# Patient Record
Sex: Male | Born: 1955 | Race: White | Hispanic: No | Marital: Married
Health system: Southern US, Community
[De-identification: ages and names within clinical notes are randomized; demographics above are authoritative.]

---

## 2018-10-18 ENCOUNTER — Encounter (HOSPITAL_COMMUNITY): Payer: Self-pay | Admitting: Emergency Medicine

## 2018-10-18 ENCOUNTER — Emergency Department (HOSPITAL_COMMUNITY): Payer: BLUE CROSS/BLUE SHIELD

## 2018-10-18 ENCOUNTER — Inpatient Hospital Stay (HOSPITAL_COMMUNITY)
Admission: EM | Admit: 2018-10-18 | Discharge: 2018-10-20 | DRG: 871 | Disposition: A | Discharge status: Home / Self Care | Payer: BLUE CROSS/BLUE SHIELD | Attending: Internal Medicine | Admitting: Internal Medicine

## 2018-10-18 ENCOUNTER — Other Ambulatory Visit: Payer: Self-pay

## 2018-10-18 DIAGNOSIS — J189 Pneumonia, unspecified organism: Secondary | ICD-10-CM

## 2018-10-18 DIAGNOSIS — I10 Essential (primary) hypertension: Secondary | ICD-10-CM

## 2018-10-18 DIAGNOSIS — R197 Diarrhea, unspecified: Secondary | ICD-10-CM

## 2018-10-18 DIAGNOSIS — A419 Sepsis, unspecified organism: Principal | ICD-10-CM

## 2018-10-18 DIAGNOSIS — R110 Nausea: Secondary | ICD-10-CM | POA: Diagnosis present

## 2018-10-18 DIAGNOSIS — N179 Acute kidney failure, unspecified: Secondary | ICD-10-CM

## 2018-10-18 DIAGNOSIS — R6520 Severe sepsis without septic shock: Secondary | ICD-10-CM

## 2018-10-18 DIAGNOSIS — D72828 Other elevated white blood cell count: Secondary | ICD-10-CM

## 2018-10-18 DIAGNOSIS — D696 Thrombocytopenia, unspecified: Secondary | ICD-10-CM | POA: Diagnosis present

## 2018-10-18 DIAGNOSIS — E785 Hyperlipidemia, unspecified: Secondary | ICD-10-CM | POA: Diagnosis present

## 2018-10-18 DIAGNOSIS — R0902 Hypoxemia: Secondary | ICD-10-CM

## 2018-10-18 DIAGNOSIS — J181 Lobar pneumonia, unspecified organism: Secondary | ICD-10-CM | POA: Diagnosis not present

## 2018-10-18 LAB — COMPREHENSIVE METABOLIC PANEL
ALT: 34 U/L (ref 0–44)
AST: 31 U/L (ref 15–41)
Albumin: 3.9 g/dL (ref 3.5–5.0)
Alkaline Phosphatase: 54 U/L (ref 38–126)
Anion gap: 8 (ref 5–15)
BUN: 15 mg/dL (ref 8–23)
CHLORIDE: 105 mmol/L (ref 98–111)
CO2: 25 mmol/L (ref 22–32)
Calcium: 8.7 mg/dL — ABNORMAL LOW (ref 8.9–10.3)
Creatinine, Ser: 1.18 mg/dL (ref 0.61–1.24)
GFR calc Af Amer: >60 mL/min (ref >60)
GFR calc non Af Amer: >60 mL/min (ref >60)
Glucose, Bld: 134 mg/dL — ABNORMAL HIGH (ref 70–99)
Potassium: 4 mmol/L (ref 3.5–5.1)
Sodium: 138 mmol/L (ref 135–145)
Total Bilirubin: 0.9 mg/dL (ref 0.3–1.2)
Total Protein: 6.6 g/dL (ref 6.5–8.1)

## 2018-10-18 LAB — URINALYSIS, ROUTINE W REFLEX MICROSCOPIC
Bilirubin Urine: NEGATIVE
Glucose, UA: NEGATIVE mg/dL
Hgb urine dipstick: NEGATIVE
Ketones, ur: NEGATIVE mg/dL
Leukocytes,Ua: NEGATIVE
Nitrite: NEGATIVE
PROTEIN: NEGATIVE mg/dL
Specific Gravity, Urine: 1.021 (ref 1.005–1.030)
pH: 5 (ref 5.0–8.0)

## 2018-10-18 LAB — CBC
HCT: 44 % (ref 39.0–52.0)
HCT: 37.3 % — ABNORMAL LOW (ref 39.0–52.0)
Hemoglobin: 14.4 g/dL (ref 13.0–17.0)
Hemoglobin: 12.7 g/dL — ABNORMAL LOW (ref 13.0–17.0)
MCH: 28.6 pg (ref 26.0–34.0)
MCH: 28.9 pg (ref 26.0–34.0)
MCHC: 32.7 g/dL (ref 30.0–36.0)
MCHC: 34 g/dL (ref 30.0–36.0)
MCV: 87.5 fL (ref 80.0–100.0)
MCV: 85 fL (ref 80.0–100.0)
NRBC: 0 % (ref 0.0–0.2)
Platelets: 195 10*3/uL (ref 150–400)
Platelets: 147 10*3/uL — ABNORMAL LOW (ref 150–400)
RBC: 5.03 MIL/uL (ref 4.22–5.81)
RBC: 4.39 MIL/uL (ref 4.22–5.81)
RDW: 12 % (ref 11.5–15.5)
RDW: 11.9 % (ref 11.5–15.5)
WBC: 20 10*3/uL — ABNORMAL HIGH (ref 4.0–10.5)
WBC: 14.4 10*3/uL — ABNORMAL HIGH (ref 4.0–10.5)
nRBC: 0 % (ref 0.0–0.2)

## 2018-10-18 LAB — CREATININE, SERUM
Creatinine, Ser: 1.27 mg/dL — ABNORMAL HIGH (ref 0.61–1.24)
GFR calc Af Amer: >60 mL/min (ref >60)
GFR calc non Af Amer: >60 mL/min (ref >60)

## 2018-10-18 LAB — LACTIC ACID, PLASMA
Lactic Acid, Venous: 1.4 mmol/L (ref 0.5–1.9)
Lactic Acid, Venous: 1.2 mmol/L (ref 0.5–1.9)

## 2018-10-18 LAB — INFLUENZA PANEL BY PCR (TYPE A & B)
Influenza A By PCR: NEGATIVE
Influenza B By PCR: NEGATIVE

## 2018-10-18 LAB — POC OCCULT BLOOD, ED: FECAL OCCULT BLD: NEGATIVE

## 2018-10-18 MED ORDER — SODIUM CHLORIDE 0.9 % IV BOLUS
1000.0000 mL | Freq: Once | INTRAVENOUS | Status: AC
  Administered 2018-10-18: 1000 mL via INTRAVENOUS

## 2018-10-18 MED ORDER — IOHEXOL 300 MG/ML  SOLN
100.0000 mL | Freq: Once | INTRAMUSCULAR | Status: AC | PRN
  Administered 2018-10-18: 100 mL via INTRAVENOUS

## 2018-10-18 MED ORDER — ENOXAPARIN SODIUM 40 MG/0.4ML ~~LOC~~ SOLN
40.0000 mg | SUBCUTANEOUS | Status: DC
  Administered 2018-10-18 – 2018-10-19 (×2): 40 mg via SUBCUTANEOUS
  Filled 2018-10-18 (×2): qty 0.4

## 2018-10-18 MED ORDER — ONDANSETRON HCL 4 MG/2ML IJ SOLN: 4.0000 mg | Freq: Four times a day (QID) | INTRAMUSCULAR | Status: DC | PRN

## 2018-10-18 MED ORDER — ONDANSETRON HCL 4 MG/2ML IJ SOLN
4.0000 mg | Freq: Once | INTRAMUSCULAR | Status: AC
  Administered 2018-10-18: 4 mg via INTRAVENOUS
  Filled 2018-10-18: qty 2

## 2018-10-18 MED ORDER — ALPRAZOLAM 0.25 MG PO TABS: 0.2500 mg | ORAL_TABLET | Freq: Every day | ORAL | Status: DC | PRN

## 2018-10-18 MED ORDER — PANTOPRAZOLE SODIUM 40 MG PO TBEC
40.0000 mg | DELAYED_RELEASE_TABLET | Freq: Every day | ORAL | Status: DC
  Administered 2018-10-18 – 2018-10-19 (×2): 40 mg via ORAL
  Filled 2018-10-18 (×2): qty 1

## 2018-10-18 MED ORDER — SODIUM CHLORIDE 0.9 % IV BOLUS
250.0000 mL | Freq: Once | INTRAVENOUS | Status: AC
  Administered 2018-10-18: 250 mL via INTRAVENOUS

## 2018-10-18 MED ORDER — SODIUM CHLORIDE 0.9 % IV SOLN
1.0000 g | Freq: Once | INTRAVENOUS | Status: AC
  Administered 2018-10-18: 1 g via INTRAVENOUS
  Filled 2018-10-18: qty 10

## 2018-10-18 MED ORDER — ALPRAZOLAM 0.5 MG PO TABS: 0.5000 mg | ORAL_TABLET | Freq: Three times a day (TID) | ORAL | Status: DC | PRN

## 2018-10-18 MED ORDER — ONDANSETRON HCL 4 MG PO TABS: 4.0000 mg | ORAL_TABLET | Freq: Four times a day (QID) | ORAL | Status: DC | PRN

## 2018-10-18 MED ORDER — LORAZEPAM 2 MG/ML IJ SOLN
1.0000 mg | Freq: Once | INTRAMUSCULAR | Status: AC
  Administered 2018-10-18: 1 mg via INTRAVENOUS
  Filled 2018-10-18: qty 1

## 2018-10-18 MED ORDER — SODIUM CHLORIDE 0.9 % IV SOLN
1.0000 g | INTRAVENOUS | Status: DC
  Administered 2018-10-18 – 2018-10-19 (×2): 1 g via INTRAVENOUS
  Filled 2018-10-18 (×2): qty 10

## 2018-10-18 MED ORDER — DEXTROSE-NACL 5-0.9 % IV SOLN
INTRAVENOUS | Status: DC
  Administered 2018-10-18 – 2018-10-19 (×3): via INTRAVENOUS

## 2018-10-18 MED ORDER — ACETAMINOPHEN 650 MG RE SUPP: 650.0000 mg | Freq: Four times a day (QID) | RECTAL | Status: DC | PRN

## 2018-10-18 MED ORDER — SODIUM CHLORIDE 0.9 % IV SOLN
500.0000 mg | Freq: Once | INTRAVENOUS | Status: AC
  Administered 2018-10-18: 500 mg via INTRAVENOUS
  Filled 2018-10-18: qty 500

## 2018-10-18 MED ORDER — DEXTROSE-NACL 5-0.9 % IV SOLN: INTRAVENOUS | Status: DC

## 2018-10-18 MED ORDER — EZETIMIBE-SIMVASTATIN 10-20 MG PO TABS
1.0000 | ORAL_TABLET | Freq: Every day | ORAL | Status: DC
  Administered 2018-10-19: 1 via ORAL
  Filled 2018-10-18: qty 1

## 2018-10-18 MED ORDER — AZITHROMYCIN 500 MG PO TABS
500.0000 mg | ORAL_TABLET | Freq: Every day | ORAL | Status: DC
  Administered 2018-10-18 – 2018-10-19 (×2): 500 mg via ORAL
  Filled 2018-10-18 (×2): qty 1

## 2018-10-18 MED ORDER — PAROXETINE HCL 20 MG PO TABS
20.0000 mg | ORAL_TABLET | Freq: Every day | ORAL | Status: DC
  Administered 2018-10-19: 20 mg via ORAL
  Filled 2018-10-18 (×2): qty 1

## 2018-10-18 MED ORDER — ACETAMINOPHEN 325 MG PO TABS
650.0000 mg | ORAL_TABLET | Freq: Four times a day (QID) | ORAL | Status: DC | PRN
  Administered 2018-10-18: 650 mg via ORAL
  Filled 2018-10-18: qty 2

## 2018-10-18 NOTE — H&P (Addendum)
History and Physical    Jadiah Genovese LNL:[id number] DOB: 04/21/1956 DOA: 10/18/2018  PCP: Patient, No Pcp Per   Patient coming from: Home   Chief Complaint:  Dyspnea   HPI: Zyron Krishal Freitag is a 63 y.o. male with medical history significant of hypertension and dyslipidemia.  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 He has been at his usual state of health until this morning when he felt generalized malaise. After breakfast he started having diarrhea, 3-4 watery stools.  He felt dizzy, and lightheaded, very weak.  He does mention fullness in his precordial region, moderate in intensity, persistent since this morning, no improving or worsening factors.  He has noted cough but no sputum production.  Denies any fevers or chills.  Due to persistent symptoms he presented to the emergency room.  He follows up with [facility] for his primary care.  Old records personally reviewed, last visit from September 05, 2017 with his primary care physician, listed that he takes alprazolam 0.25 mg daily, Vytorin, lisinopril and paroxetine.  ED Course: Patient was found ill looking appearing, tachycardic and tachypneic, but afebrile.  Significant leukocytosis and a positive infiltrate at the left lower lobe.  He was diagnosed with sepsis, received IV fluid resuscitation, IV antibiotic therapy and referred for admission for evaluation.  Review of Systems:  1. General: No fevers, no chills, no weight gain or weight loss. Positive malaise as mentioned in HPI.  2. ENT: No runny nose or sore throat, no hearing disturbances 3. Pulmonary: No dyspnea, positive cough, but no wheezing, or hemoptysis 4. Cardiovascular: No angina, claudication, lower extremity edema, pnd or orthopnea 5. Gastrointestinal: No nausea or vomiting, positive diarrhea, non bloody. No abdominal pain.  6. Hematology: No easy bruisability or frequent  infections 7. Urology: No dysuria, hematuria or increased urinary frequency 8. Dermatology: No rashes. 9. Neurology: No seizures or paresthesias 10. Musculoskeletal: No joint pain or deformities  Social history: Denies any tobacco alcohol or illegal drug abuse  Family history: Positive for heart disease    Prior to Admission medications   Not on File    Physical Exam: Vitals:   10/18/18 1414 10/18/18 1415 10/18/18 1445 10/18/18 1515  BP:  (!) 143/86 (!) 143/76 125/80  Pulse:  (!) 116 (!) 118 (!) 114  Resp:  (!) 21 (!) 9 (!) 25  Temp: 98.4 F (36.9 C)     TempSrc: Oral     SpO2:  95% 94% 94%  Weight:      Height:        Vitals:   10/18/18 1414 10/18/18 1415 10/18/18 1445 10/18/18 1515  BP:  (!) 143/86 (!) 143/76 125/80  Pulse:  (!) 116 (!) 118 (!) 114  Resp:  (!) 21 (!) 9 (!) 25  Temp: 98.4 F (36.9 C)     TempSrc: Oral     SpO2:  95% 94% 94%  Weight:      Height:       General: deconditioned and ill looking appearing Neurology: Awake and alert, non focal. Slow to respond to questions.  Head and Neck. Head normocephalic. Neck supple with no adenopathy or thyromegaly.   E ENT: mild pallor, no icterus, oral mucosa dry Cardiovascular: No JVD. S1-S2 present, rhythmic, no gallops, rubs, or murmurs. No lower extremity edema. Pulmonary: positive breath sounds bilaterally, poor air movement, positive expiratory wheezing, bilateral rhonchi and bibasilar rales, predominantly at the  left base. Gastrointestinal. Abdomen with no organomegaly, non tender, no rebound or guarding Skin. No rashes Musculoskeletal: no joint deformities    Labs on Admission: I have personally reviewed following labs and imaging studies  CBC: Recent Labs  Lab 10/18/18 1307  WBC 20.0*  HGB 14.4  HCT 44.0  MCV 87.5  PLT 195   Basic Metabolic Panel: Recent Labs  Lab 10/18/18 1307  NA 138  K 4.0  CL 105  CO2 25  GLUCOSE 134*  BUN 15  CREATININE 1.18  CALCIUM 8.7*   GFR: Estimated  Creatinine Clearance: 60.7 mL/min (by C-G formula based on SCr of 1.18 mg/dL). Liver Function Tests: Recent Labs  Lab 10/18/18 1307  AST 31  ALT 34  ALKPHOS 54  BILITOT 0.9  PROT 6.6  ALBUMIN 3.9   No results for input(s): LIPASE, AMYLASE in the last 168 hours. No results for input(s): AMMONIA in the last 168 hours. Coagulation Profile: No results for input(s): INR, PROTIME in the last 168 hours. Cardiac Enzymes: No results for input(s): CKTOTAL, CKMB, CKMBINDEX, TROPONINI in the last 168 hours. BNP (last 3 results) No results for input(s): PROBNP in the last 8760 hours. HbA1C: No results for input(s): HGBA1C in the last 72 hours. CBG: No results for input(s): GLUCAP in the last 168 hours. Lipid Profile: No results for input(s): CHOL, HDL, LDLCALC, TRIG, CHOLHDL, LDLDIRECT in the last 72 hours. Thyroid Function Tests: No results for input(s): TSH, T4TOTAL, FREET4, T3FREE, THYROIDAB in the last 72 hours. Anemia Panel: No results for input(s): VITAMINB12, FOLATE, FERRITIN, TIBC, IRON, RETICCTPCT in the last 72 hours. Urine analysis: No results found for: COLORURINE, APPEARANCEUR, LABSPEC, PHURINE, GLUCOSEU, HGBUR, BILIRUBINUR, KETONESUR, PROTEINUR, UROBILINOGEN, NITRITE, LEUKOCYTESUR  Radiological Exams on Admission: Ct Abdomen Pelvis W Contrast  Result Date: 10/18/2018 CLINICAL DATA:  Acute generalized abdominal pain. EXAM: CT ABDOMEN AND PELVIS WITH CONTRAST TECHNIQUE: Multidetector CT imaging of the abdomen and pelvis was performed using the standard protocol following bolus administration of intravenous contrast. CONTRAST:  OMNIPAQUE IOHEXOL 300 MG/ML  SOLN COMPARISON:  None. FINDINGS: Lower chest: Left lower lobe airspace opacity is noted concerning for pneumonia. Moderate size sliding-type hiatal hernia is noted. Hepatobiliary: No gallstones or biliary dilatation is noted. Multiple hepatic cysts are noted. Pancreas: Unremarkable. No pancreatic ductal dilatation or  surrounding inflammatory changes. Spleen: Normal in size without focal abnormality. Adrenals/Urinary Tract: Adrenal glands are unremarkable. Kidneys are normal, without renal calculi, focal lesion, or hydronephrosis. Bladder is unremarkable. Stomach/Bowel: Stomach is within normal limits. Appendix appears normal. No evidence of bowel wall thickening, distention, or inflammatory changes. Vascular/Lymphatic: No significant vascular findings are present. No enlarged abdominal or pelvic lymph nodes. Reproductive: Prostate is unremarkable. Other: No abdominal wall hernia or abnormality. No abdominopelvic ascites. Musculoskeletal: No acute or significant osseous findings. IMPRESSION: Moderate size sliding-type hiatal hernia is noted. Left lower lobe airspace opacity is noted consistent with pneumonia. No other abnormality seen in the abdomen or pelvis. Electronically Signed   By: Lupita Raider, M.D.   On: 10/18/2018 16:28   Dg Chest Port 1 View  Result Date: 10/18/2018 CLINICAL DATA:  Cough. EXAM: PORTABLE CHEST 1 VIEW COMPARISON:  None. FINDINGS: Normal sized heart. Patchy airspace opacity in the left lower lung zone. This most likely involving the lingula and left lower lobe. The right lung is clear. No visible pleural fluid. Mild lower thoracic spine degenerative changes. IMPRESSION: Left lower lung zone pneumonia, most likely involving the lingula and left lower lobe. Electronically Signed  By: Beckie Salts M.D.   On: 10/18/2018 15:35    EKG: Independently reviewed.  100 bpm, sinus rhythm, normal axis, right bundle branch block, inferior lateral small Q waves, T wave inversion in lead III and aVF.  No significant ST segment changes.  Assessment/Plan Active Problems:   Sepsis [facility])  63 year old male with significant past medical history for hypertension and dyslipidemia who presents with acute change in his overall health consistent with watery diarrhea, malaise, generalized weakness and fullness  sensation in his precordial region.  Positive cough but no significant sputum production.  No fevers.  On his initial physical examination his temperature is 98.4 F, blood pressure 154/84, heart rate 114, respiratory rate 27, oxygen saturation 93%.  He does have dry mucous membranes, significant abnormal lung auscultation with decreased breath sounds, expiratory wheezing, diffuse rhonchi and predominantly left basilar rales.  Heart S1-S2 present, tachycardic, abdomen soft, no lower extremity edema, no rashes.  Sodium 138, potassium 4.0, chloride 105, bicarb 25, glucose 134, BUN 15, creatinine 1.18, AST 31, ALT 34, white count 20.0, hemoglobin 14.4, hematocrit 44.0, platelets 195.  Chest x-ray, hypoinflated, left lower lobe alveolar infiltrate.  CT of the abdomen, base of the lungs with a left dense alveolar infiltrate.  Patient will be admitted to the hospital with the working diagnosis of sepsis due to left lower lobe community-acquired pneumonia, complicated by acute kidney injury.   1.  Sepsis due to left lower lobe community-acquired pneumonia (present on admission).  Patient presents with atypical symptoms but his physical examination and radiographic evaluation are consistent with left lower lobe community-acquired pneumonia.  Patient will be admitted to the telemetry unit, continue IV fluids with isotonic saline, broad spectrum antibiotic therapy with intravenous ceftriaxone and oral azithromycin. Will order urinary antigens for pneumococcus and Legionella.  Follow-up cell count, temperature curve and cultures.  Continue oximetry monitoring.  Follow-up on influenza screen, his GI symptoms might be secondary to a viral syndrome.   2.  Acute kidney injury.  Elevated creatinine up to 1.18, will continue isotonic saline at 100 mL/h as fluid resuscitation, follow-up urine output, avoid hypotension and nephrotoxic agents.  Check renal panel in the morning.  3.  Hypertension.  Hold on antihypertensive  agents due to risk of hypotension.  4.  Dyslipidemia.  Continue antihyperlipidemic agents.  DVT prophylaxis:  Enoxaparin  Code Status:  full  Family Communication: no family at the bedside   Disposition Plan:  Telemetry   Consults called: none   Admission status: Inpatient.     Dayanara Sherrill Annett Gula MD [facility]   10/18/2018, 4:36 PM

## 2018-10-18 NOTE — ED Notes (Signed)
Pt SpO2 of 86% at this time while patient is resting. Pt placed on 2L [place] by this RN and patient SpO2 now 98% on 2L via [place].

## 2018-10-18 NOTE — ED Triage Notes (Signed)
Pt BIB [facility]. Per EMS patient was at work when he developed sudden onset of nausea, vomiting, and diarrhea. Per patient the diarrhea was  Black and tarry in appearance. Per EMS pt had mulitple hypotensive episodes while vomiting of blood pressure being 50/30 and then 60/30.  Pt was given 4 of zofran by EMS  And ems reports patient did not have anymore hypotensive episodes following the zofran. Pt was given of NSS. Pt denying any pain upon arrival to ED states he "doesn't feel well." Pt is alert and oriented x4 at present.

## 2018-10-18 NOTE — Progress Notes (Signed)
Pt temp 102.1. Tylenol given. Doctor notified. Will continue to monitor.

## 2018-10-18 NOTE — Progress Notes (Signed)
Attempted to call for report. 

## 2018-10-18 NOTE — ED Provider Notes (Signed)
  Physical Exam  BP 125/80   Pulse (!) 114   Temp 98.4 F (36.9 C) (Oral)   Resp (!) 25   Ht 5\' 7"  (1.702 m)   Wt 72.6 kg   SpO2 94%   BMI 25.06 kg/m   Physical Exam  ED Course/Procedures     Procedures  MDM  Received care of pt from Dr. Denton Lank at [facility]. Presented with nausea, vomiting, diarrhea, mild dry nonproductive cough.  CXR concerning for pneumonia.  Ordered additional fluid to total 30cc/kg, and abx for CAP.  Dr. Denton Lank called for admission with CT abdomen pending.        Alvira Monday, MD 10/19/18 1141

## 2018-10-18 NOTE — Progress Notes (Signed)
[name] is a 63 y.o. male patient admitted from ED awake, alert - oriented  X 4 - no acute distress noted.  VSS - Blood pressure 101/76, pulse (!) 115, temperature (!) 102.1 F (38.9 C), temperature source Oral, resp. rate (!) 25, height 5\' 7"  (1.702 m), weight 72.6 kg, SpO2 97 %.    IV in place, occlusive dsg intact without redness.  Orientation to room, and floor completed with information packet given to patient/family.  Patient declined safety video at this time.  Admission INP armband ID verified with patient/family, and in place.   SR up x 2, fall assessment complete, with patient and family able to verbalize understanding of risk associated with falls, and verbalized understanding to call nsg before up out of bed.  Call light within reach, patient able to voice, and demonstrate understanding.  Skin, clean-dry- intact without evidence of bruising, or skin tears.   No evidence of skin break down noted on exam.     Will cont to eval and treat per MD orders.  Jeralene Peters, RN 10/18/2018 6:46 PM

## 2018-10-18 NOTE — ED Provider Notes (Addendum)
[facility] EMERGENCY DEPARTMENT Provider Note   CSN: [id number] Arrival date & time: 10/18/18  1235    History   Chief Complaint Chief Complaint  Patient presents with  . Diarrhea    HPI Levy Richy Didier is a 63 y.o. male.     Patient c/o acute onset nausea, vomiting and diarrhea today. Several diarrhea stools, watery, not bloody. Nausea, emesis x 1, not bloody. Abdominal cramping, dull, diffuse, moderate, non radiating. No known bad food ingestion or ill contacts. No fever or chills. +episodic non prod cough, no sore throat or other uri symptoms. No dysuria. Feels lightheaded when stands. bp low per ems. No recent travel. No known ill contacts. No recent bad food exposure or recent abx use.   The history is provided by the patient and the EMS personnel.    History reviewed. No pertinent past medical history.  There are no active problems to display for this patient.   History reviewed. No pertinent surgical history.      Home Medications    Prior to Admission medications   Not on File    Family History No family history on file.  Social History Social History   Tobacco Use  . Smoking status: Not on file  Substance Use Topics  . Alcohol use: Not on file  . Drug use: Not on file     Allergies   Patient has no allergy information on record.   Review of Systems Review of Systems  Constitutional: Negative for fever.  HENT: Negative for sore throat.   Eyes: Negative for redness.  Respiratory: Positive for cough. Negative for shortness of breath.   Cardiovascular: Negative for chest pain.  Gastrointestinal: Positive for abdominal pain, diarrhea, nausea and vomiting.  Endocrine: Negative for polyuria.  Genitourinary: Negative for flank pain.  Musculoskeletal: Negative for back pain and neck pain.  Skin: Negative for rash.  Neurological: Negative for headaches.  Hematological: Does not bruise/bleed easily.   Psychiatric/Behavioral: Negative for confusion.     Physical Exam Updated Vital Signs BP (!) 141/87   Pulse (!) 115   Temp 98.4 F (36.9 C) (Oral)   Resp (!) 27   Ht 1.702 m (5\' 7" )   Wt 72.6 kg   SpO2 92%   BMI 25.06 kg/m   Physical Exam Vitals signs and nursing note reviewed.  Constitutional:      Appearance: Normal appearance. He is well-developed.  HENT:     Head: Atraumatic.     Nose: Nose normal.     Mouth/Throat:     Mouth: Mucous membranes are moist.     Pharynx: Oropharynx is clear.  Eyes:     General: No scleral icterus.    Conjunctiva/sclera: Conjunctivae normal.     Pupils: Pupils are equal, round, and reactive to light.  Neck:     Musculoskeletal: Normal range of motion and neck supple. No neck rigidity.     Trachea: No tracheal deviation.  Cardiovascular:     Rate and Rhythm: Normal rate and regular rhythm.     Pulses: Normal pulses.     Heart sounds: Normal heart sounds. No murmur. No friction rub. No gallop.   Pulmonary:     Effort: Pulmonary effort is normal. No accessory muscle usage or respiratory distress.     Breath sounds: Normal breath sounds.  Abdominal:     General: Bowel sounds are normal. There is no distension.     Palpations: Abdomen is soft. There is no mass.  Tenderness: There is no abdominal tenderness. There is no guarding or rebound.     Hernia: No hernia is present.  Genitourinary:    Comments: No cva tenderness. Light brown watery stool.  Musculoskeletal:        General: No swelling.  Skin:    General: Skin is warm and dry.     Findings: No rash.  Neurological:     Mental Status: He is alert.     Comments: Alert, speech clear.   Psychiatric:        Mood and Affect: Mood normal.      ED Treatments / Results  Labs (all labs ordered are listed, but only abnormal results are displayed) Results for orders placed or performed during the hospital encounter of 10/18/18  CBC  Result Value Ref Range   WBC 20.0 (H) 4.0 -  10.5 K/uL   RBC 5.03 4.22 - 5.81 MIL/uL   Hemoglobin 14.4 13.0 - 17.0 g/dL   HCT 23.9 53.2 - 02.3 %   MCV 87.5 80.0 - 100.0 fL   MCH 28.6 26.0 - 34.0 pg   MCHC 32.7 30.0 - 36.0 g/dL   RDW 34.3 56.8 - 61.6 %   Platelets 195 150 - 400 K/uL   nRBC 0.0 0.0 - 0.2 %  Comprehensive metabolic panel  Result Value Ref Range   Sodium 138 135 - 145 mmol/L   Potassium 4.0 3.5 - 5.1 mmol/L   Chloride 105 98 - 111 mmol/L   CO2 25 22 - 32 mmol/L   Glucose, Bld 134 (H) 70 - 99 mg/dL   BUN 15 8 - 23 mg/dL   Creatinine, Ser 8.37 0.61 - 1.24 mg/dL   Calcium 8.7 (L) 8.9 - 10.3 mg/dL   Total Protein 6.6 6.5 - 8.1 g/dL   Albumin 3.9 3.5 - 5.0 g/dL   AST 31 15 - 41 U/L   ALT 34 0 - 44 U/L   Alkaline Phosphatase 54 38 - 126 U/L   Total Bilirubin 0.9 0.3 - 1.2 mg/dL   GFR calc non Af Amer >60 >60 mL/min   GFR calc Af Amer >60 >60 mL/min   Anion gap 8 5 - 15  POC occult blood, ED Provider will collect  Result Value Ref Range   Fecal Occult Bld NEGATIVE NEGATIVE    EKG EKG Interpretation  Date/Time:  Friday October 18 2018 15:43:31 EDT Ventricular Rate:  110 PR Interval:    QRS Duration: 113 QT Interval:  341 QTC Calculation: 462 R Axis:   44 Text Interpretation:  Sinus tachycardia Incomplete right bundle branch block Non-specific ST-t changes No previous tracing Confirmed by Cathren Laine ([id number]) on 10/18/2018 4:21:06 PM   Radiology Dg Chest Port 1 View  Result Date: 10/18/2018 CLINICAL DATA:  Cough. EXAM: PORTABLE CHEST 1 VIEW COMPARISON:  None. FINDINGS: Normal sized heart. Patchy airspace opacity in the left lower lung zone. This most likely involving the lingula and left lower lobe. The right lung is clear. No visible pleural fluid. Mild lower thoracic spine degenerative changes. IMPRESSION: Left lower lung zone pneumonia, most likely involving the lingula and left lower lobe. Electronically Signed   By: Beckie Salts M.D.   On: 10/18/2018 15:35    Procedures Procedures (including critical  care time)  Medications Ordered in ED Medications  ondansetron (ZOFRAN) injection 4 mg (has no administration in time range)  sodium chloride 0.9 % bolus 1,000 mL (has no administration in time range)  LORazepam (ATIVAN) injection 1 mg (has  no administration in time range)  sodium chloride 0.9 % bolus 1,000 mL (0 mLs Intravenous Stopped 10/18/18 1437)  ondansetron (ZOFRAN) injection 4 mg (4 mg Intravenous Given 10/18/18 1305)     Initial Impression / Assessment and Plan / ED Course  I have reviewed the triage vital signs and the nursing notes.  Pertinent labs & imaging results that were available during my care of the patient were reviewed by me and considered in my medical decision making (see chart for details).  Iv ns bolus. zofran iv.   Labs sent.   Reviewed nursing notes and prior charts for additional history.   Labs reviewed - chem normal. Wbc markedly elevated. Mid to lower abd tenderness on exam. Will get ct.   Ct pending.   Additional ivf.   Nausea returns, zofran iv.   Signed out to Dr Dalene Seltzer to check pending labs (UA), cxr, and CT.   cxr later returned c/w pna. On recheck, room air pulse ox 88%, 2 liters o2 [place].  Pt denies chest pain. +non prod cough.  Reaffirmed with pt, no recent travel, or known contacts with those who have travelled (except 1 family member to [place]).   Medicine service consulted for admission.  CT reading remains pending.   Final Clinical Impressions(s) / ED Diagnoses   Final diagnoses:  None    ED Discharge Orders    None         Cathren Laine, MD 10/18/18 1623

## 2018-10-18 NOTE — ED Notes (Signed)
ED TO INPATIENT HANDOFF REPORT  ED Nurse Name and Phone #: Joni Reining, RN  S Name/Age/Gender Danny Mckinney 63 y.o. male Room/Bed: 018C/018C  Code Status   Code Status: Full Code  Home/SNF/Other Home Patient oriented to: self, place, time and situation Is this baseline? Yes   Triage Complete: Triage complete  Chief Complaint gi bleed  Triage Note Pt BIB [facility]. Per EMS patient was at work when he developed sudden onset of nausea, vomiting, and diarrhea. Per patient the diarrhea was  Black and tarry in appearance. Per EMS pt had mulitple hypotensive episodes while vomiting of blood pressure being 50/30 and then 60/30.  Pt was given 4 of zofran by EMS  And ems reports patient did not have anymore hypotensive episodes following the zofran. Pt was given of NSS. Pt denying any pain upon arrival to ED states he "doesn't feel well." Pt is alert and oriented x4 at present.    Allergies Not on File  Level of Care/Admitting Diagnosis ED Disposition    ED Disposition Condition Comment   Admit  Hospital Area: [facility] [[id number]]  Level of Care: Telemetry Medical [104]  Diagnosis: Sepsis [facility]) [[id number]]  Admitting Physician: Coralie Keens [[id number]]  Attending Physician: Coralie Keens [[id number]]  Estimated length of stay: 3 - 4 days  Certification:: I certify this patient will need inpatient services for at least 2 midnights  PT Class (Do Not Modify): Inpatient [101]  PT Acc Code (Do Not Modify): Private [1]       B Medical/Surgery History History reviewed. No pertinent past medical history. History reviewed. No pertinent surgical history.   A IV Location/Drains/Wounds Patient Lines/Drains/Airways Status   Active Line/Drains/Airways    Name:   Placement date:   Placement time:   Site:   Days:   Peripheral IV 10/18/18 Left Antecubital   10/18/18    1220    Antecubital   less than 1          Intake/Output Last 24  hours  Intake/Output Summary (Last 24 hours) at 10/18/2018 1652 Last data filed at 10/18/2018 1437 Gross per 24 hour  Intake 1800 ml  Output -  Net 1800 ml    Labs/Imaging Results for orders placed or performed during the hospital encounter of 10/18/18 (from the past 48 hour(s))  CBC     Status: Abnormal   Collection Time: 10/18/18  1:07 PM  Result Value Ref Range   WBC 20.0 (H) 4.0 - 10.5 K/uL   RBC 5.03 4.22 - 5.81 MIL/uL   Hemoglobin 14.4 13.0 - 17.0 g/dL   HCT 43.1 54.0 - 08.6 %   MCV 87.5 80.0 - 100.0 fL   MCH 28.6 26.0 - 34.0 pg   MCHC 32.7 30.0 - 36.0 g/dL   RDW 76.1 95.0 - 93.2 %   Platelets 195 150 - 400 K/uL   nRBC 0.0 0.0 - 0.2 %    Comment: Performed at [facility], [address], [place], [place] [address]  Comprehensive metabolic panel     Status: Abnormal   Collection Time: 10/18/18  1:07 PM  Result Value Ref Range   Sodium 138 135 - 145 mmol/L   Potassium 4.0 3.5 - 5.1 mmol/L   Chloride 105 98 - 111 mmol/L   CO2 25 22 - 32 mmol/L   Glucose, Bld 134 (H) 70 - 99 mg/dL   BUN 15 8 - 23 mg/dL   Creatinine, Ser 5.80 0.61 - 1.24 mg/dL  Calcium 8.7 (L) 8.9 - 10.3 mg/dL   Total Protein 6.6 6.5 - 8.1 g/dL   Albumin 3.9 3.5 - 5.0 g/dL   AST 31 15 - 41 U/L   ALT 34 0 - 44 U/L   Alkaline Phosphatase 54 38 - 126 U/L   Total Bilirubin 0.9 0.3 - 1.2 mg/dL   GFR calc non Af Amer >60 >60 mL/min   GFR calc Af Amer >60 >60 mL/min   Anion gap 8 5 - 15    Comment: Performed at [facility], [address] [place], [place] [address]  POC occult blood, ED Provider will collect     Status: None   Collection Time: 10/18/18  1:16 PM  Result Value Ref Range   Fecal Occult Bld NEGATIVE NEGATIVE   Ct Abdomen Pelvis W Contrast  Result Date: 10/18/2018 CLINICAL DATA:  Acute generalized abdominal pain. EXAM: CT ABDOMEN AND PELVIS WITH CONTRAST TECHNIQUE: Multidetector CT imaging of the abdomen and pelvis was performed using the standard protocol following bolus  administration of intravenous contrast. CONTRAST:  OMNIPAQUE IOHEXOL 300 MG/ML  SOLN COMPARISON:  None. FINDINGS: Lower chest: Left lower lobe airspace opacity is noted concerning for pneumonia. Moderate size sliding-type hiatal hernia is noted. Hepatobiliary: No gallstones or biliary dilatation is noted. Multiple hepatic cysts are noted. Pancreas: Unremarkable. No pancreatic ductal dilatation or surrounding inflammatory changes. Spleen: Normal in size without focal abnormality. Adrenals/Urinary Tract: Adrenal glands are unremarkable. Kidneys are normal, without renal calculi, focal lesion, or hydronephrosis. Bladder is unremarkable. Stomach/Bowel: Stomach is within normal limits. Appendix appears normal. No evidence of bowel wall thickening, distention, or inflammatory changes. Vascular/Lymphatic: No significant vascular findings are present. No enlarged abdominal or pelvic lymph nodes. Reproductive: Prostate is unremarkable. Other: No abdominal wall hernia or abnormality. No abdominopelvic ascites. Musculoskeletal: No acute or significant osseous findings. IMPRESSION: Moderate size sliding-type hiatal hernia is noted. Left lower lobe airspace opacity is noted consistent with pneumonia. No other abnormality seen in the abdomen or pelvis. Electronically Signed   By: Lupita Raider, M.D.   On: 10/18/2018 16:28   Dg Chest Port 1 View  Result Date: 10/18/2018 CLINICAL DATA:  Cough. EXAM: PORTABLE CHEST 1 VIEW COMPARISON:  None. FINDINGS: Normal sized heart. Patchy airspace opacity in the left lower lung zone. This most likely involving the lingula and left lower lobe. The right lung is clear. No visible pleural fluid. Mild lower thoracic spine degenerative changes. IMPRESSION: Left lower lung zone pneumonia, most likely involving the lingula and left lower lobe. Electronically Signed   By: Beckie Salts M.D.   On: 10/18/2018 15:35    Pending Labs Unresulted Labs (From admission, onward)    Start      Ordered   10/25/18 0500  Creatinine, serum  (enoxaparin (LOVENOX)    CrCl >/= 30 ml/min)  Weekly,   R    Comments:  while on enoxaparin therapy    10/18/18 1648   10/19/18 0500  Basic metabolic panel  Tomorrow morning,   R     10/18/18 1648   10/19/18 0500  CBC  Tomorrow morning,   R     10/18/18 1648   10/18/18 1649  HIV antibody (Routine Testing)  Once,   R     10/18/18 1648   10/18/18 1649  CBC  (enoxaparin (LOVENOX)    CrCl >/= 30 ml/min)  Once,   R    Comments:  Baseline for enoxaparin therapy IF NOT ALREADY DRAWN.  Notify  MD if PLT < 100 K.    10/18/18 1648   10/18/18 1649  Creatinine, serum  (enoxaparin (LOVENOX)    CrCl >/= 30 ml/min)  Once,   R    Comments:  Baseline for enoxaparin therapy IF NOT ALREADY DRAWN.    10/18/18 1648   10/18/18 1619  Influenza panel by PCR (type A & B)  (Influenza PCR Panel)  Once,   R     10/18/18 1618   10/18/18 1613  Lactic acid, plasma  Now then every 2 hours,   STAT     10/18/18 1614   10/18/18 1612  Blood culture (routine x 2)  BLOOD CULTURE X 2,   STAT     10/18/18 1614   10/18/18 1506  Urinalysis, Routine w reflex microscopic  ONCE - STAT,   R     10/18/18 1505          Vitals/Pain Today's Vitals   10/18/18 1415 10/18/18 1445 10/18/18 1515 10/18/18 1630  BP: (!) 143/86 (!) 143/76 125/80 122/73  Pulse: (!) 116 (!) 118 (!) 114 (!) 116  Resp: (!) 21 (!) 9 (!) 25 (!) 29  Temp:      TempSrc:      SpO2: 95% 94% 94% 93%  Weight:      Height:        Isolation Precautions Droplet precaution  Medications Medications  cefTRIAXone (ROCEPHIN) 1 g in sodium chloride 0.9 % 100 mL IVPB (1 g Intravenous New Bag/Given 10/18/18 1641)  azithromycin (ZITHROMAX) 500 mg in sodium chloride 0.9 % 250 mL IVPB (has no administration in time range)  enoxaparin (LOVENOX) injection 40 mg (has no administration in time range)  acetaminophen (TYLENOL) tablet 650 mg (has no administration in time range)    Or  acetaminophen (TYLENOL) suppository 650  mg (has no administration in time range)  ondansetron (ZOFRAN) tablet 4 mg (has no administration in time range)    Or  ondansetron (ZOFRAN) injection 4 mg (has no administration in time range)  sodium chloride 0.9 % bolus 1,000 mL (0 mLs Intravenous Stopped 10/18/18 1437)  ondansetron (ZOFRAN) injection 4 mg (4 mg Intravenous Given 10/18/18 1305)  ondansetron (ZOFRAN) injection 4 mg (4 mg Intravenous Given 10/18/18 1520)  sodium chloride 0.9 % bolus 1,000 mL (1,000 mLs Intravenous New Bag/Given 10/18/18 1520)  LORazepam (ATIVAN) injection 1 mg (1 mg Intravenous Given 10/18/18 1520)  iohexol (OMNIPAQUE) 300 MG/ML solution 100 mL (100 mLs Intravenous Contrast Given 10/18/18 1552)  sodium chloride 0.9 % bolus 250 mL (250 mLs Intravenous New Bag/Given 10/18/18 1641)    Mobility walks with person assist     Focused Assessments Pulmonary Assessment Handoff:  Lung sounds:            R Recommendations: See Admitting Provider Note  Report given to:   Additional Notes:

## 2018-10-19 DIAGNOSIS — J189 Pneumonia, unspecified organism: Secondary | ICD-10-CM

## 2018-10-19 LAB — CBC
HCT: 33.5 % — ABNORMAL LOW (ref 39.0–52.0)
Hemoglobin: 11.3 g/dL — ABNORMAL LOW (ref 13.0–17.0)
MCH: 28.8 pg (ref 26.0–34.0)
MCHC: 33.7 g/dL (ref 30.0–36.0)
MCV: 85.5 fL (ref 80.0–100.0)
Platelets: 127 10*3/uL — ABNORMAL LOW (ref 150–400)
RBC: 3.92 MIL/uL — ABNORMAL LOW (ref 4.22–5.81)
RDW: 12.3 % (ref 11.5–15.5)
WBC: 11.2 10*3/uL — ABNORMAL HIGH (ref 4.0–10.5)
nRBC: 0 % (ref 0.0–0.2)

## 2018-10-19 LAB — BASIC METABOLIC PANEL
Anion gap: 7 (ref 5–15)
BUN: 11 mg/dL (ref 8–23)
CO2: 23 mmol/L (ref 22–32)
Calcium: 7.7 mg/dL — ABNORMAL LOW (ref 8.9–10.3)
Chloride: 105 mmol/L (ref 98–111)
Creatinine, Ser: 1.28 mg/dL — ABNORMAL HIGH (ref 0.61–1.24)
GFR calc Af Amer: >60 mL/min (ref >60)
GFR calc non Af Amer: 60 mL/min — ABNORMAL LOW (ref >60)
Glucose, Bld: 124 mg/dL — ABNORMAL HIGH (ref 70–99)
Potassium: 3.6 mmol/L (ref 3.5–5.1)
Sodium: 135 mmol/L (ref 135–145)

## 2018-10-19 LAB — HIV ANTIBODY (ROUTINE TESTING W REFLEX): HIV Screen 4th Generation wRfx: NONREACTIVE

## 2018-10-19 LAB — STREP PNEUMONIAE URINARY ANTIGEN: Strep Pneumo Urinary Antigen: NEGATIVE

## 2018-10-19 MED ORDER — SIMETHICONE 80 MG PO CHEW
80.0000 mg | CHEWABLE_TABLET | Freq: Once | ORAL | Status: AC
  Administered 2018-10-19: 80 mg via ORAL
  Filled 2018-10-19: qty 1

## 2018-10-19 MED ORDER — LOPERAMIDE HCL 2 MG PO CAPS
2.0000 mg | ORAL_CAPSULE | Freq: Four times a day (QID) | ORAL | Status: DC | PRN
  Administered 2018-10-19: 2 mg via ORAL
  Filled 2018-10-19: qty 1

## 2018-10-19 NOTE — Plan of Care (Signed)
  Problem: Education: Goal: Knowledge of General Education information will improve Description Including pain rating scale, medication(s)/side effects and non-pharmacologic comfort measures Outcome: Progressing   Problem: Health Behavior/Discharge Planning: Goal: Ability to manage health-related needs will improve Outcome: Progressing   Problem: Clinical Measurements: Goal: Ability to maintain clinical measurements within normal limits will improve Outcome: Progressing Goal: Will remain free from infection Outcome: Progressing Goal: Diagnostic test results will improve Outcome: Progressing Goal: Respiratory complications will improve Outcome: Progressing Goal: Cardiovascular complication will be avoided Outcome: Progressing   Problem: Coping: Goal: Level of anxiety will decrease Outcome: Progressing   Problem: Activity: Goal: Ability to tolerate increased activity will improve Outcome: Progressing   Problem: Clinical Measurements: Goal: Ability to maintain a body temperature in the normal range will improve Outcome: Progressing   Problem: Respiratory: Goal: Ability to maintain adequate ventilation will improve Outcome: Progressing Goal: Ability to maintain a clear airway will improve Outcome: Progressing

## 2018-10-19 NOTE — Progress Notes (Addendum)
Patient ID: Danny Mckinney, male   DOB: [birth year], 63 y.o.   MRN: [id number]  PROGRESS NOTE    Danny Mckinney  WVX:[id number] DOB: [birth year] DOA: 10/18/2018 PCP: Patient, No Pcp Per   Brief Narrative:  63 year old male with history of hypertension and dyslipidemia presented with dyspnea and diarrhea on 10/18/2018.  He was found to have pneumonia and started on IV antibiotics.  Assessment & Plan:   Active Problems:   Sepsis (HCC)   Community acquired pneumonia   AKI (acute kidney injury) (HCC)   Dyslipidemia   Essential hypertension  Sepsis from pneumonia -Follow cultures.  Antibiotic plan as below.  Blood pressure on the lower side but stable.   Thrombocytopenia -Probably from above.  Monitor.  No signs of bleeding  Community-acquired left lower lobar pneumonia -Continue Rocephin and Zithromax.  Follow cultures. -Influenza negative.  Leukocytosis -From above.  Improving  Hypertension -Blood pressure on the lower side.  Antihypertensives on hold.  Monitor  Acute kidney injury -Monitor creatinine.  Encourage oral intake.  Decrease IV fluids to 75 cc an hour.  Dyslipidemia--continue antihyperlipidemic agents   DVT prophylaxis: Lovenox Code Status: Full Family Communication: None at bedside Disposition Plan: Home tomorrow if remains afebrile and if respiratory status remains stable  Consultants: None  Procedures: None  Antimicrobials: Rocephin and Zithromax from 10/18/2018 onwards   Subjective: Patient seen and examined at bedside.  He feels slightly better.  His diarrhea is improving.  Has not tried eating it.  Had fever yesterday evening.  No nausea or vomiting.  Objective: Vitals:   10/18/18 1957 10/18/18 1958 10/18/18 2102 10/19/18 0433  BP:  (!) 92/46 106/61 113/74  Pulse:  (!) 116 (!) 104 (!) 106  Resp:   19   Temp: 98.2 F (36.8 C)  99.9 F (37.7 C) 99.8 F (37.7 C)  TempSrc: Oral  Oral Oral  SpO2:  97% 93% 93%  Weight:      Height:         Intake/Output Summary (Last 24 hours) at 10/19/2018 1031 Last data filed at 10/19/2018 0500 Gross per 24 hour  Intake 3839.47 ml  Output 325 ml  Net 3514.47 ml   Filed Weights   10/18/18 1241 10/18/18 1829  Weight: 72.6 kg 72.6 kg    Examination:  General exam: Appears calm and comfortable  Respiratory system: Bilateral decreased breath sounds at bases, some scattered crackles Cardiovascular system: S1 & S2 heard, tachycardic Gastrointestinal system: Abdomen is nondistended, soft and nontender. Normal bowel sounds heard. Extremities: No cyanosis, clubbing, edema    Data Reviewed: I have personally reviewed following labs and imaging studies  CBC: Recent Labs  Lab 10/18/18 1307 10/18/18 1847 10/19/18 0459  WBC 20.0* 14.4* 11.2*  HGB 14.4 12.7* 11.3*  HCT 44.0 37.3* 33.5*  MCV 87.5 85.0 85.5  PLT 195 147* 127*   Basic Metabolic Panel: Recent Labs  Lab 10/18/18 1307 10/18/18 1847 10/19/18 0459  NA 138  --  135  K 4.0  --  3.6  CL 105  --  105  CO2 25  --  23  GLUCOSE 134*  --  124*  BUN 15  --  11  CREATININE 1.18 1.27* 1.28*  CALCIUM 8.7*  --  7.7*   GFR: Estimated Creatinine Clearance: 55.9 mL/min (A) (by C-G formula based on SCr of 1.28 mg/dL (H)). Liver Function Tests: Recent Labs  Lab 10/18/18 1307  AST 31  ALT 34  ALKPHOS 54  BILITOT 0.9  PROT 6.6  ALBUMIN 3.9   No results for input(s): LIPASE, AMYLASE in the last 168 hours. No results for input(s): AMMONIA in the last 168 hours. Coagulation Profile: No results for input(s): INR, PROTIME in the last 168 hours. Cardiac Enzymes: No results for input(s): CKTOTAL, CKMB, CKMBINDEX, TROPONINI in the last 168 hours. BNP (last 3 results) No results for input(s): PROBNP in the last 8760 hours. HbA1C: No results for input(s): HGBA1C in the last 72 hours. CBG: No results for input(s): GLUCAP in the last 168 hours. Lipid Profile: No results for input(s): CHOL, HDL, LDLCALC, TRIG, CHOLHDL,  LDLDIRECT in the last 72 hours. Thyroid Function Tests: No results for input(s): TSH, T4TOTAL, FREET4, T3FREE, THYROIDAB in the last 72 hours. Anemia Panel: No results for input(s): VITAMINB12, FOLATE, FERRITIN, TIBC, IRON, RETICCTPCT in the last 72 hours. Sepsis Labs: Recent Labs  Lab 10/18/18 1643 10/18/18 1847  LATICACIDVEN 1.4 1.2    No results found for this or any previous visit (from the past 240 hour(s)).       Radiology Studies: Ct Abdomen Pelvis W Contrast  Result Date: 10/18/2018 CLINICAL DATA:  Acute generalized abdominal pain. EXAM: CT ABDOMEN AND PELVIS WITH CONTRAST TECHNIQUE: Multidetector CT imaging of the abdomen and pelvis was performed using the standard protocol following bolus administration of intravenous contrast. CONTRAST:  OMNIPAQUE IOHEXOL 300 MG/ML  SOLN COMPARISON:  None. FINDINGS: Lower chest: Left lower lobe airspace opacity is noted concerning for pneumonia. Moderate size sliding-type hiatal hernia is noted. Hepatobiliary: No gallstones or biliary dilatation is noted. Multiple hepatic cysts are noted. Pancreas: Unremarkable. No pancreatic ductal dilatation or surrounding inflammatory changes. Spleen: Normal in size without focal abnormality. Adrenals/Urinary Tract: Adrenal glands are unremarkable. Kidneys are normal, without renal calculi, focal lesion, or hydronephrosis. Bladder is unremarkable. Stomach/Bowel: Stomach is within normal limits. Appendix appears normal. No evidence of bowel wall thickening, distention, or inflammatory changes. Vascular/Lymphatic: No significant vascular findings are present. No enlarged abdominal or pelvic lymph nodes. Reproductive: Prostate is unremarkable. Other: No abdominal wall hernia or abnormality. No abdominopelvic ascites. Musculoskeletal: No acute or significant osseous findings. IMPRESSION: Moderate size sliding-type hiatal hernia is noted. Left lower lobe airspace opacity is noted consistent with pneumonia. No  other abnormality seen in the abdomen or pelvis. Electronically Signed   By: Lupita Raider, M.D.   On: 10/18/2018 16:28   Dg Chest Port 1 View  Result Date: 10/18/2018 CLINICAL DATA:  Cough. EXAM: PORTABLE CHEST 1 VIEW COMPARISON:  None. FINDINGS: Normal sized heart. Patchy airspace opacity in the left lower lung zone. This most likely involving the lingula and left lower lobe. The right lung is clear. No visible pleural fluid. Mild lower thoracic spine degenerative changes. IMPRESSION: Left lower lung zone pneumonia, most likely involving the lingula and left lower lobe. Electronically Signed   By: Beckie Salts M.D.   On: 10/18/2018 15:35        Scheduled Meds:  azithromycin  500 mg Oral Daily   enoxaparin (LOVENOX) injection  40 mg Subcutaneous Q24H   ezetimibe-simvastatin  1 tablet Oral q1800   pantoprazole  40 mg Oral Daily   PARoxetine  20 mg Oral Daily   Continuous Infusions:  cefTRIAXone (ROCEPHIN)  IV Stopped (10/18/18 2120)   dextrose 5 % and 0.9% NaCl     dextrose 5 % and 0.9% NaCl 100 mL/hr at 10/19/18 0813     LOS: 1 day        Glade Lloyd, MD [facility] 10/19/2018, 10:31 AM

## 2018-10-19 NOTE — Progress Notes (Signed)
Dr. Hanley Ben notified about patient having 4 watery stools so far today.  New orders received.

## 2018-10-20 LAB — CBC WITH DIFFERENTIAL/PLATELET
Abs Immature Granulocytes: 0.02 10*3/uL (ref 0.00–0.07)
Basophils Absolute: 0 10*3/uL (ref 0.0–0.1)
Basophils Relative: 0 %
EOS PCT: 1 %
Eosinophils Absolute: 0.1 10*3/uL (ref 0.0–0.5)
HCT: 30.7 % — ABNORMAL LOW (ref 39.0–52.0)
HEMOGLOBIN: 10.3 g/dL — AB (ref 13.0–17.0)
Immature Granulocytes: 0 %
LYMPHS PCT: 10 %
Lymphs Abs: 0.5 10*3/uL — ABNORMAL LOW (ref 0.7–4.0)
MCH: 29.1 pg (ref 26.0–34.0)
MCHC: 33.6 g/dL (ref 30.0–36.0)
MCV: 86.7 fL (ref 80.0–100.0)
Monocytes Absolute: 0.4 10*3/uL (ref 0.1–1.0)
Monocytes Relative: 7 %
Neutro Abs: 4.3 10*3/uL (ref 1.7–7.7)
Neutrophils Relative %: 82 %
Platelets: 116 10*3/uL — ABNORMAL LOW (ref 150–400)
RBC: 3.54 MIL/uL — ABNORMAL LOW (ref 4.22–5.81)
RDW: 12.3 % (ref 11.5–15.5)
WBC: 5.3 10*3/uL (ref 4.0–10.5)
nRBC: 0 % (ref 0.0–0.2)

## 2018-10-20 LAB — BASIC METABOLIC PANEL
Anion gap: 6 (ref 5–15)
BUN: 7 mg/dL — ABNORMAL LOW (ref 8–23)
CO2: 26 mmol/L (ref 22–32)
Calcium: 7.9 mg/dL — ABNORMAL LOW (ref 8.9–10.3)
Chloride: 107 mmol/L (ref 98–111)
Creatinine, Ser: 1.19 mg/dL (ref 0.61–1.24)
GFR calc Af Amer: >60 mL/min (ref >60)
GFR calc non Af Amer: >60 mL/min (ref >60)
Glucose, Bld: 106 mg/dL — ABNORMAL HIGH (ref 70–99)
Potassium: 3.5 mmol/L (ref 3.5–5.1)
Sodium: 139 mmol/L (ref 135–145)

## 2018-10-20 LAB — LEGIONELLA PNEUMOPHILA SEROGP 1 UR AG: L. pneumophila Serogp 1 Ur Ag: NEGATIVE

## 2018-10-20 LAB — MAGNESIUM: Magnesium: 1.8 mg/dL (ref 1.7–2.4)

## 2018-10-20 MED ORDER — CEFUROXIME AXETIL 500 MG PO TABS: 500.0000 mg | ORAL_TABLET | Freq: Two times a day (BID) | ORAL | 0 refills | Status: AC

## 2018-10-20 MED ORDER — LOPERAMIDE HCL 2 MG PO CAPS: 2.0000 mg | ORAL_CAPSULE | Freq: Three times a day (TID) | ORAL | 0 refills | Status: AC | PRN

## 2018-10-20 MED ORDER — ONDANSETRON HCL 4 MG PO TABS: 4.0000 mg | ORAL_TABLET | Freq: Four times a day (QID) | ORAL | 0 refills | Status: AC | PRN

## 2018-10-20 NOTE — Discharge Summary (Signed)
Physician Discharge Summary  Danny Mckinney UJW:[id number] DOB: [birth year] DOA: 10/18/2018  PCP: Patient, No Pcp Per  Admit date: 10/18/2018 Discharge date: 10/20/2018  Admitted From: Home Disposition: Home  Recommendations for Outpatient Follow-up:  1. Follow up with PCP in 1 week with repeat CBC/BMP 2. Follow up in ED if symptoms worsen or new appear   Home Health: No Equipment/Devices: None  Discharge Condition: Stable CODE STATUS: Full Diet recommendation: Heart healthy  Brief/Interim Summary: 63 year old male with history of hypertension and dyslipidemia presented with dyspnea and diarrhea on 10/18/2018.  He was found to have pneumonia and started on IV antibiotics.His condition improved during the hospitalization.  His diarrhea improved.  Cultures have been negative so far.  He will be discharged home on oral antibiotics.  Discharge Diagnoses:  Active Problems:   Sepsis (HCC)   Community acquired pneumonia   AKI (acute kidney injury) (HCC)   Dyslipidemia   Essential hypertension   Sepsis from pneumonia -Cultures have been negative so far.  Antibiotic plan as below.  -Sepsis has resolved.  Thrombocytopenia -Probably from above.  Monitor.  No signs of bleeding.  Outpatient follow-up  Community-acquired left lower lobar pneumonia -Currently on Rocephin and Zithromax.    Cultures negative.  Afebrile.  Respiratory status stable.  Discharged on oral Ceftin for 3 more days. -Influenza negative.  Leukocytosis -From above.  Improving  Hypertension -Blood pressure on the lower side.  Keep lisinopril on hold till evaluation by PCP.  Acute kidney injury -Treated with IV fluids.  Creatinine is improving.  Dyslipidemia--continue antihyperlipidemic agents Discharge Instructions  Discharge Instructions    Call MD for:  extreme fatigue   Complete by:  As directed    Call MD for:  persistant dizziness or light-headedness   Complete by:  As directed    Call MD  for:  persistant nausea and vomiting   Complete by:  As directed    Call MD for:  temperature >100.4   Complete by:  As directed    Diet - low sodium heart healthy   Complete by:  As directed    Increase activity slowly   Complete by:  As directed      Allergies as of 10/20/2018   No Known Allergies     Medication List    STOP taking these medications   lisinopril 40 MG tablet Commonly known as:  PRINIVIL,ZESTRIL     TAKE these medications   ALPRAZolam 0.25 MG tablet Commonly known as:  XANAX Take 0.25 mg by mouth daily as needed (relax stomach).   cefUROXime 500 MG tablet Commonly known as:  CEFTIN Take 1 tablet (500 mg total) by mouth 2 (two) times daily for 3 days.   COQ10 PO Take 1 capsule by mouth at bedtime.   ezetimibe-simvastatin 10-20 MG tablet Commonly known as:  VYTORIN Take 1 tablet by mouth at bedtime.   loperamide 2 MG capsule Commonly known as:  IMODIUM Take 1 capsule (2 mg total) by mouth 3 (three) times daily as needed for diarrhea or loose stools.   multivitamin with minerals Tabs tablet Take 1 tablet by mouth daily. Men's 50 plus   omeprazole 40 MG capsule Commonly known as:  PRILOSEC Take 40 mg by mouth daily as needed (acid reflux/heartburn).   ondansetron 4 MG tablet Commonly known as:  ZOFRAN Take 1 tablet (4 mg total) by mouth every 6 (six) hours as needed for nausea.   PARoxetine 20 MG tablet Commonly known as:  PAXIL Take 20 mg  by mouth daily.      Follow-up Information    PCP. Schedule an appointment as soon as possible for a visit in 1 week(s).   Why:  with cbc/bmp         No Known Allergies  Consultations:  None   Procedures/Studies: Ct Abdomen Pelvis W Contrast  Result Date: 10/18/2018 CLINICAL DATA:  Acute generalized abdominal pain. EXAM: CT ABDOMEN AND PELVIS WITH CONTRAST TECHNIQUE: Multidetector CT imaging of the abdomen and pelvis was performed using the standard protocol following bolus administration of  intravenous contrast. CONTRAST:  OMNIPAQUE IOHEXOL 300 MG/ML  SOLN COMPARISON:  None. FINDINGS: Lower chest: Left lower lobe airspace opacity is noted concerning for pneumonia. Moderate size sliding-type hiatal hernia is noted. Hepatobiliary: No gallstones or biliary dilatation is noted. Multiple hepatic cysts are noted. Pancreas: Unremarkable. No pancreatic ductal dilatation or surrounding inflammatory changes. Spleen: Normal in size without focal abnormality. Adrenals/Urinary Tract: Adrenal glands are unremarkable. Kidneys are normal, without renal calculi, focal lesion, or hydronephrosis. Bladder is unremarkable. Stomach/Bowel: Stomach is within normal limits. Appendix appears normal. No evidence of bowel wall thickening, distention, or inflammatory changes. Vascular/Lymphatic: No significant vascular findings are present. No enlarged abdominal or pelvic lymph nodes. Reproductive: Prostate is unremarkable. Other: No abdominal wall hernia or abnormality. No abdominopelvic ascites. Musculoskeletal: No acute or significant osseous findings. IMPRESSION: Moderate size sliding-type hiatal hernia is noted. Left lower lobe airspace opacity is noted consistent with pneumonia. No other abnormality seen in the abdomen or pelvis. Electronically Signed   By: Lupita Raider, M.D.   On: 10/18/2018 16:28   Dg Chest Port 1 View  Result Date: 10/18/2018 CLINICAL DATA:  Cough. EXAM: PORTABLE CHEST 1 VIEW COMPARISON:  None. FINDINGS: Normal sized heart. Patchy airspace opacity in the left lower lung zone. This most likely involving the lingula and left lower lobe. The right lung is clear. No visible pleural fluid. Mild lower thoracic spine degenerative changes. IMPRESSION: Left lower lung zone pneumonia, most likely involving the lingula and left lower lobe. Electronically Signed   By: Beckie Salts M.D.   On: 10/18/2018 15:35       Subjective: Patient seen and examined at bedside.  He feels better.  No overnight  fever.  His diarrhea is improving.  No worsening cough or shortness of breath.  He feels okay to go home.  Discharge Exam: Vitals:   10/19/18 2238 10/20/18 0444  BP: 116/68 116/66  Pulse: 82 76  Resp: 18 18  Temp: 97.8 F (36.6 C) 97.9 F (36.6 C)  SpO2: 97% 95%   Vitals:   10/19/18 0433 10/19/18 1359 10/19/18 2238 10/20/18 0444  BP: 113/74 117/68 116/68 116/66  Pulse: (!) 106 83 82 76  Resp:  16 18 18   Temp: 99.8 F (37.7 C) 98.3 F (36.8 C) 97.8 F (36.6 C) 97.9 F (36.6 C)  TempSrc: Oral Oral Oral Oral  SpO2: 93% 95% 97% 95%  Weight:      Height:        General: Pt is alert, awake, not in acute distress Cardiovascular: rate controlled, S1/S2 + Respiratory: bilateral decreased breath sounds at bases, scattered crackles Abdominal: Soft, NT, ND, bowel sounds + Extremities: no edema, no cyanosis    The results of significant diagnostics from this hospitalization (including imaging, microbiology, ancillary and laboratory) are listed below for reference.     Microbiology: Recent Results (from the past 240 hour(s))  Blood culture (routine x 2)     Status: None (Preliminary  result)   Collection Time: 10/18/18  4:34 PM  Result Value Ref Range Status   Specimen Description BLOOD LEFT ANTECUBITAL  Final   Special Requests   Final    BOTTLES DRAWN AEROBIC AND ANAEROBIC Blood Culture adequate volume   Culture   Final    NO GROWTH < 24 HOURS Performed at [facility], [address], [place], [place] [address]    Report Status PENDING  Incomplete  Blood culture (routine x 2)     Status: None (Preliminary result)   Collection Time: 10/18/18  4:43 PM  Result Value Ref Range Status   Specimen Description BLOOD SITE NOT SPECIFIED  Final   Special Requests   Final    BOTTLES DRAWN AEROBIC AND ANAEROBIC Blood Culture adequate volume   Culture   Final    NO GROWTH < 24 HOURS Performed at [facility], [address] [place], [place] [address]    Report Status  PENDING  Incomplete     Labs: BNP (last 3 results) No results for input(s): BNP in the last 8760 hours. Basic Metabolic Panel: Recent Labs  Lab 10/18/18 1307 10/18/18 1847 10/19/18 0459 10/20/18 0511  NA 138  --  135 139  K 4.0  --  3.6 3.5  CL 105  --  105 107  CO2 25  --  23 26  GLUCOSE 134*  --  124* 106*  BUN 15  --  11 7*  CREATININE 1.18 1.27* 1.28* 1.19  CALCIUM 8.7*  --  7.7* 7.9*  MG  --   --   --  1.8   Liver Function Tests: Recent Labs  Lab 10/18/18 1307  AST 31  ALT 34  ALKPHOS 54  BILITOT 0.9  PROT 6.6  ALBUMIN 3.9   No results for input(s): LIPASE, AMYLASE in the last 168 hours. No results for input(s): AMMONIA in the last 168 hours. CBC: Recent Labs  Lab 10/18/18 1307 10/18/18 1847 10/19/18 0459 10/20/18 0511  WBC 20.0* 14.4* 11.2* 5.3  NEUTROABS  --   --   --  4.3  HGB 14.4 12.7* 11.3* 10.3*  HCT 44.0 37.3* 33.5* 30.7*  MCV 87.5 85.0 85.5 86.7  PLT 195 147* 127* 116*   Cardiac Enzymes: No results for input(s): CKTOTAL, CKMB, CKMBINDEX, TROPONINI in the last 168 hours. BNP: Invalid input(s): POCBNP CBG: No results for input(s): GLUCAP in the last 168 hours. D-Dimer No results for input(s): DDIMER in the last 72 hours. Hgb A1c No results for input(s): HGBA1C in the last 72 hours. Lipid Profile No results for input(s): CHOL, HDL, LDLCALC, TRIG, CHOLHDL, LDLDIRECT in the last 72 hours. Thyroid function studies No results for input(s): TSH, T4TOTAL, T3FREE, THYROIDAB in the last 72 hours.  Invalid input(s): FREET3 Anemia work up No results for input(s): VITAMINB12, FOLATE, FERRITIN, TIBC, IRON, RETICCTPCT in the last 72 hours. Urinalysis    Component Value Date/Time   COLORURINE YELLOW 10/18/2018 1635   APPEARANCEUR HAZY (A) 10/18/2018 1635   LABSPEC 1.021 10/18/2018 1635   PHURINE 5.0 10/18/2018 1635   GLUCOSEU NEGATIVE 10/18/2018 1635   HGBUR NEGATIVE 10/18/2018 1635   BILIRUBINUR NEGATIVE 10/18/2018 1635   KETONESUR NEGATIVE  10/18/2018 1635   PROTEINUR NEGATIVE 10/18/2018 1635   NITRITE NEGATIVE 10/18/2018 1635   LEUKOCYTESUR NEGATIVE 10/18/2018 1635   Sepsis Labs Invalid input(s): PROCALCITONIN,  WBC,  LACTICIDVEN Microbiology Recent Results (from the past 240 hour(s))  Blood culture (routine x 2)  Status: None (Preliminary result)   Collection Time: 10/18/18  4:34 PM  Result Value Ref Range Status   Specimen Description BLOOD LEFT ANTECUBITAL  Final   Special Requests   Final    BOTTLES DRAWN AEROBIC AND ANAEROBIC Blood Culture adequate volume   Culture   Final    NO GROWTH < 24 HOURS Performed at [facility], [address], [place], [place] [address]    Report Status PENDING  Incomplete  Blood culture (routine x 2)     Status: None (Preliminary result)   Collection Time: 10/18/18  4:43 PM  Result Value Ref Range Status   Specimen Description BLOOD SITE NOT SPECIFIED  Final   Special Requests   Final    BOTTLES DRAWN AEROBIC AND ANAEROBIC Blood Culture adequate volume   Culture   Final    NO GROWTH < 24 HOURS Performed at [facility], [address] [place], [place] [address]    Report Status PENDING  Incomplete     Time coordinating discharge: 35 minutes  SIGNED:   Glade Lloyd, MD  [facility] 10/20/2018, 9:04 AM

## 2018-10-23 LAB — CULTURE, BLOOD (ROUTINE X 2)
Culture: NO GROWTH
Culture: NO GROWTH
Special Requests: ADEQUATE
Special Requests: ADEQUATE

## 2019-11-22 IMAGING — DX PORTABLE CHEST - 1 VIEW
1 series · 1 of 1 positions shown · non-contrast
Comparison: None.

CLINICAL DATA: Cough.

EXAM:
PORTABLE CHEST 1 VIEW

[chest ap]
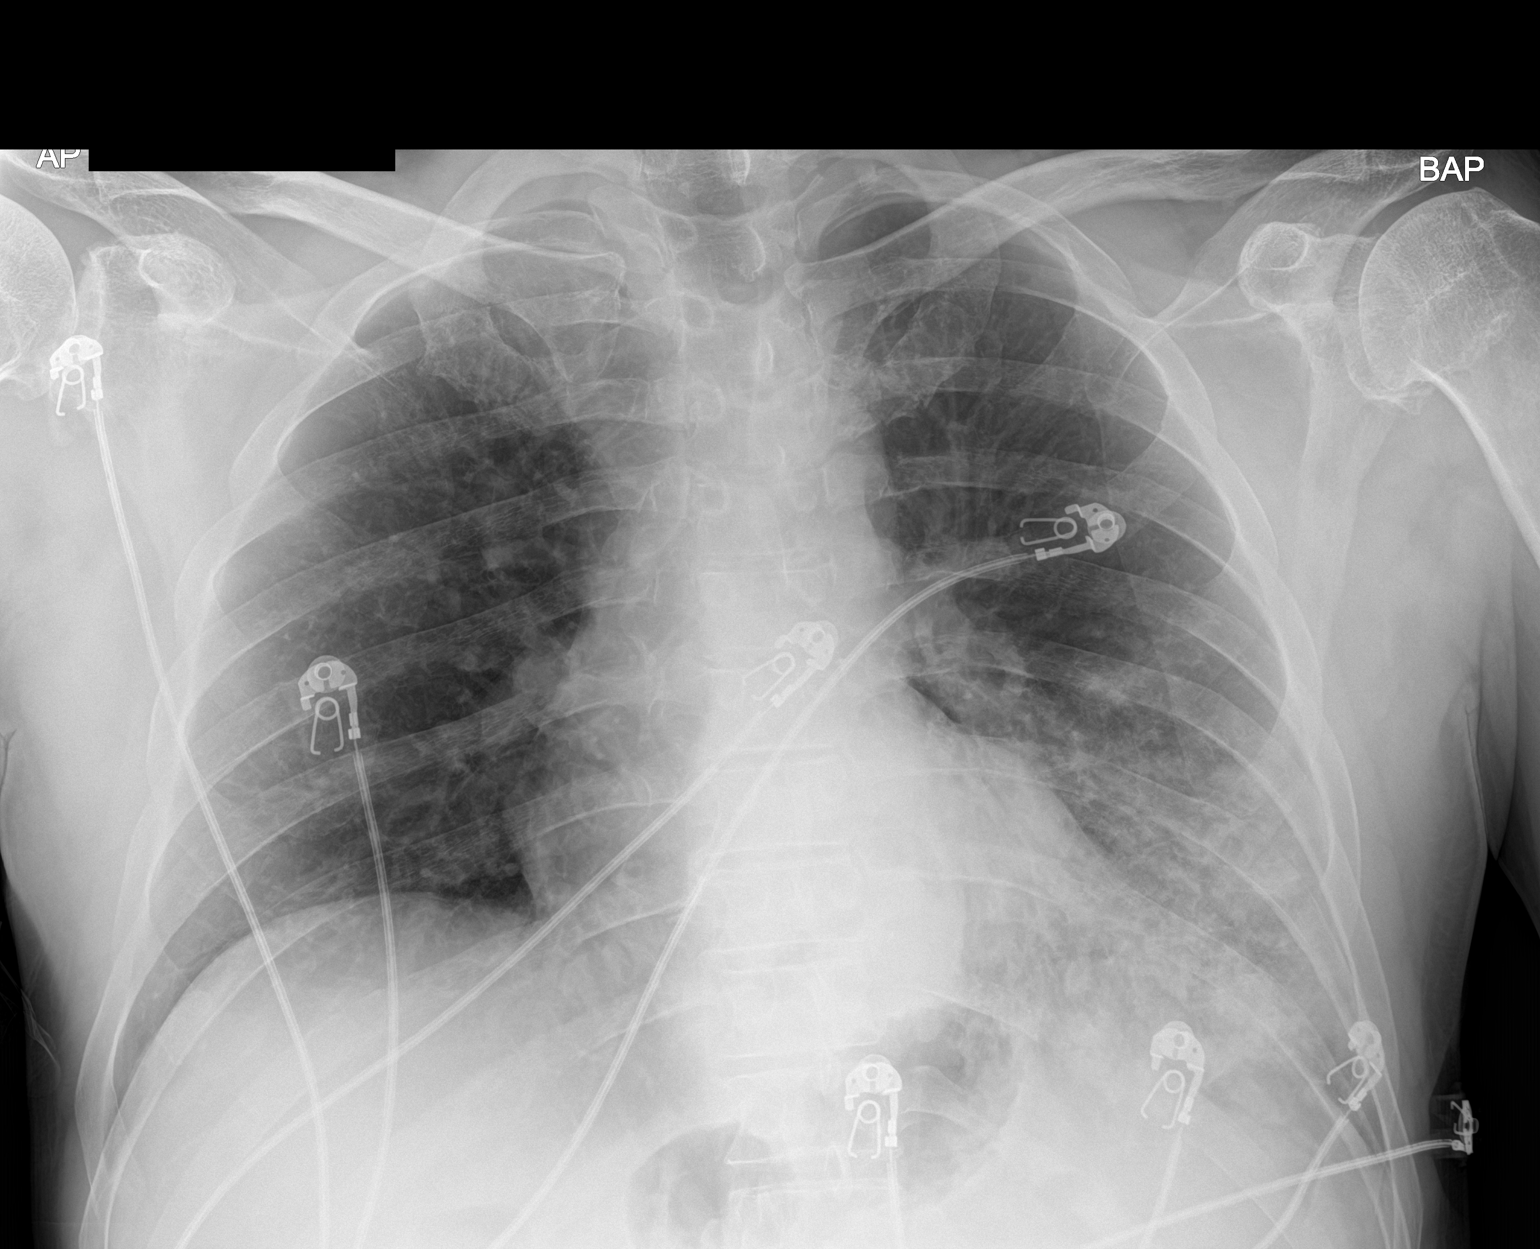

[1 of 1 positions shown; findings below may reference images not displayed]

FINDINGS: Normal sized heart. Patchy airspace opacity in the left lower lung
zone. This most likely involving the lingula and left lower lobe.
The right lung is clear. No visible pleural fluid. Mild lower
thoracic spine degenerative changes.
IMPRESSION: Left lower lung zone pneumonia, most likely involving the lingula
and left lower lobe.

## 2019-11-22 IMAGING — CT CT ABDOMEN AND PELVIS WITH CONTRAST
2 of 5 series · 16 of 46 positions shown, 18 images · IV contrast (APPLIED)
Comparison: None.

CLINICAL DATA: Acute generalized abdominal pain.

EXAM:
CT ABDOMEN AND PELVIS WITH CONTRAST
TECHNIQUE: Multidetector CT imaging of the abdomen and pelvis was performed
using the standard protocol following bolus administration of
intravenous contrast.
CONTRAST:  100mL OMNIPAQUE IOHEXOL 300 MG/ML  SOLN

[Series 3: abdomen 5.0 · axial · 0.80mm/px · z∈[+738,+1153]mm · 13 of 97 slices shown, 15 images]
[im 7/97  soft-tissue]
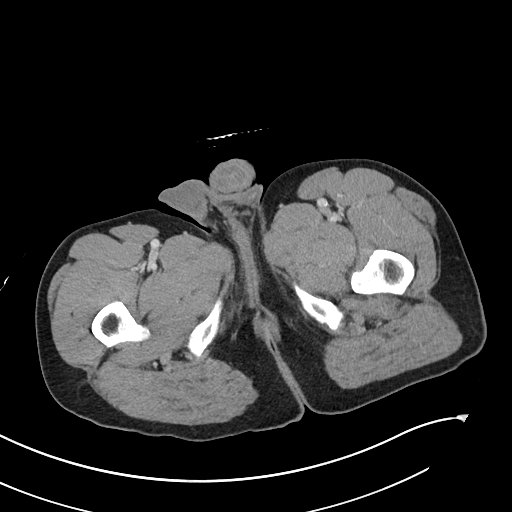
[im 7/97  bone]
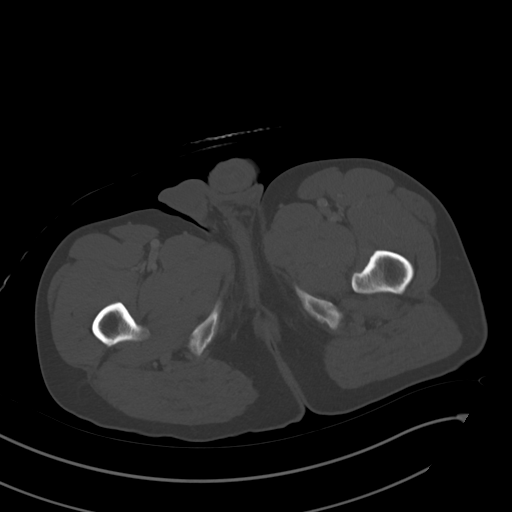
[im 14/97  soft-tissue]
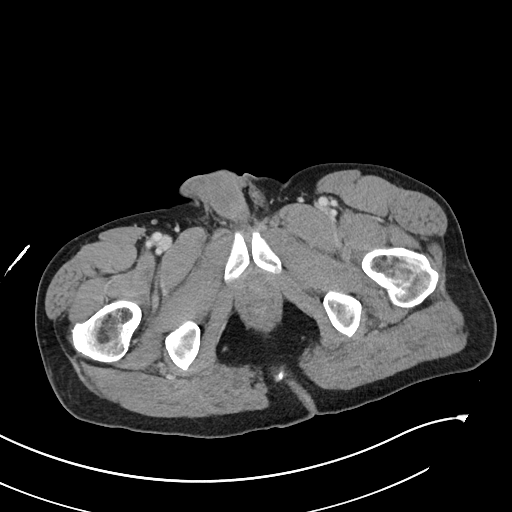
[im 21/97  soft-tissue]
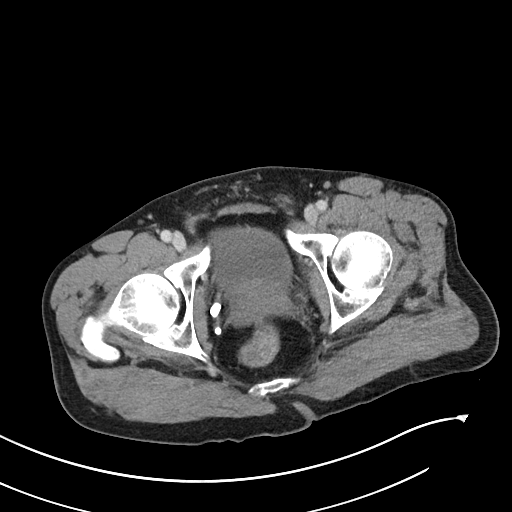
[im 28/97  soft-tissue]
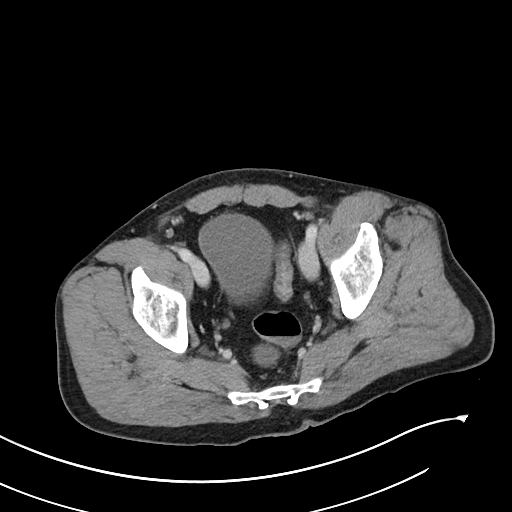
[im 35/97  soft-tissue]
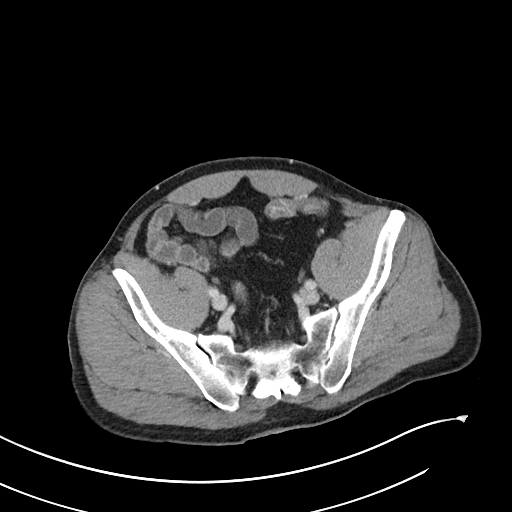
[im 42/97  soft-tissue]
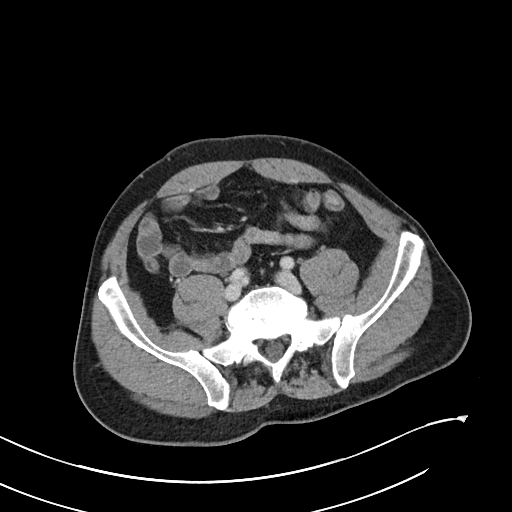
[im 49/97  soft-tissue]
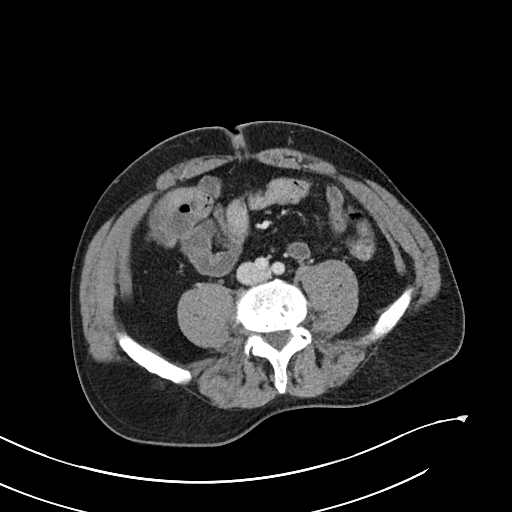
[im 55/97  soft-tissue]
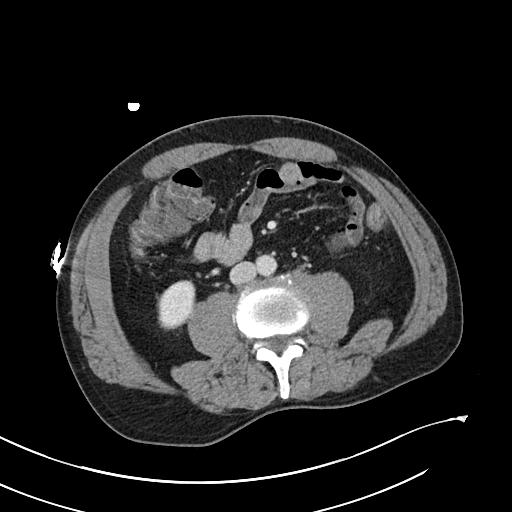
[im 62/97  soft-tissue]
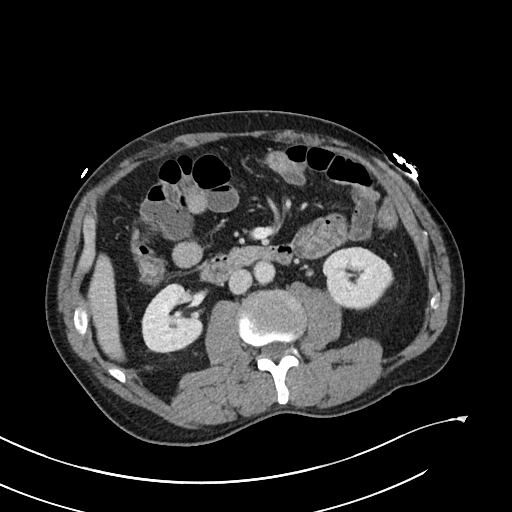
[im 62/97  bone]
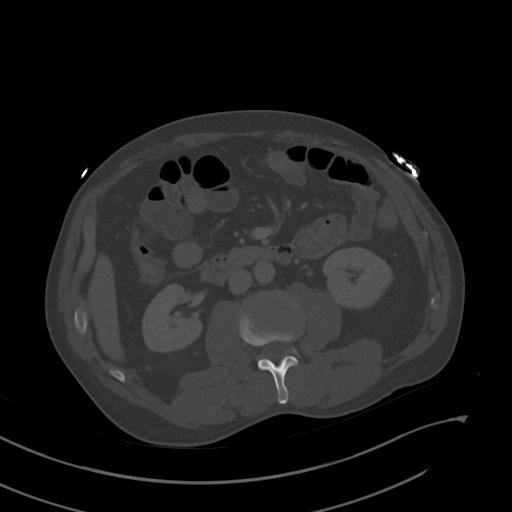
[im 69/97  soft-tissue]
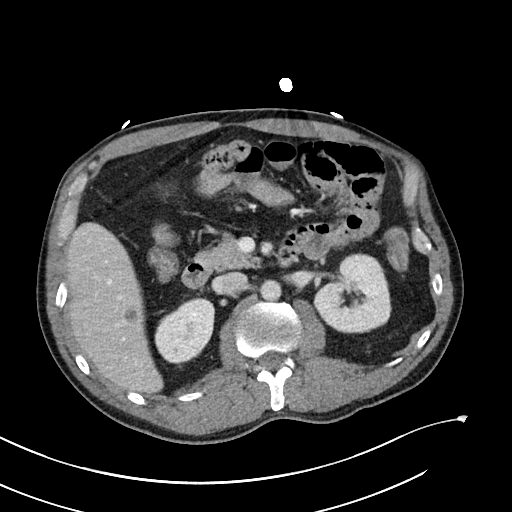
[im 76/97  soft-tissue]
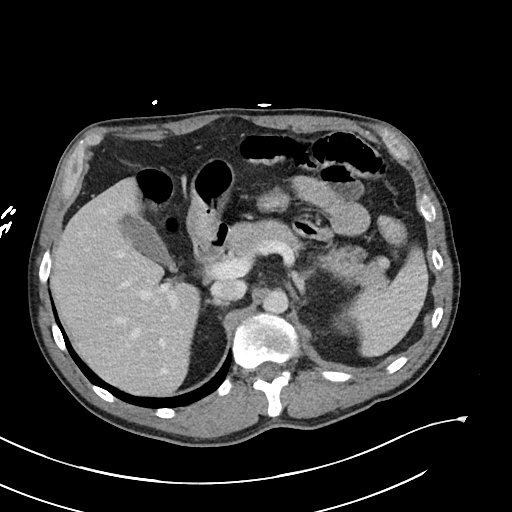
[im 83/97  soft-tissue]
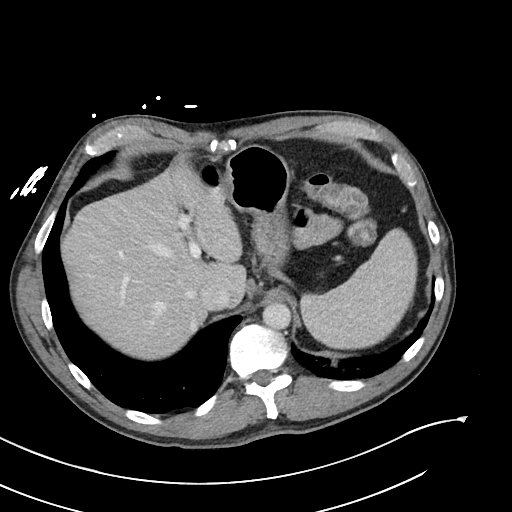
[im 90/97  soft-tissue]
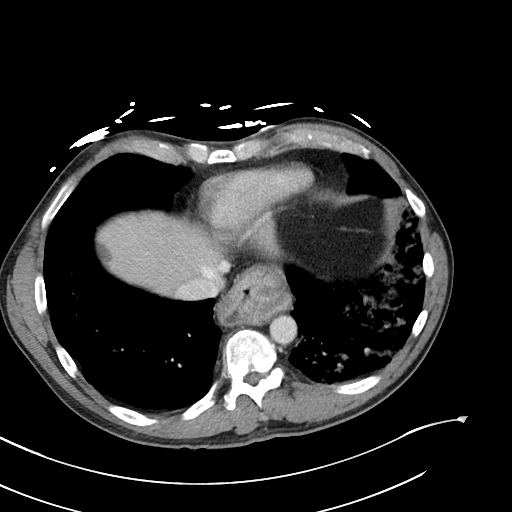

[Series 6: abdomen 3.0 mpr cor · coronal · 0.81mm/px · 3 of 101 slices shown]
[im 34/101  soft-tissue]
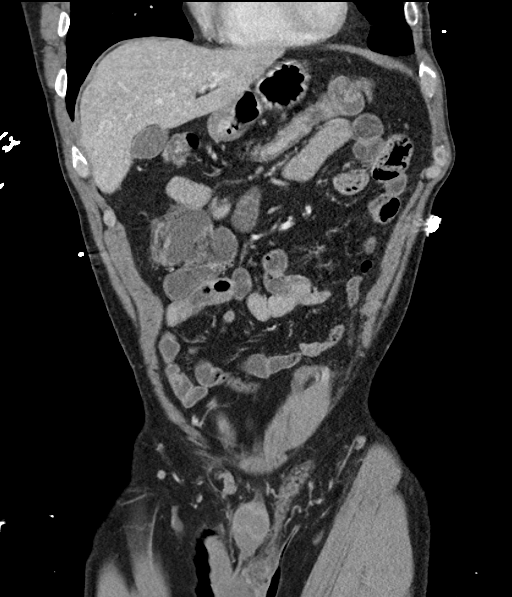
[im 45/101  soft-tissue]
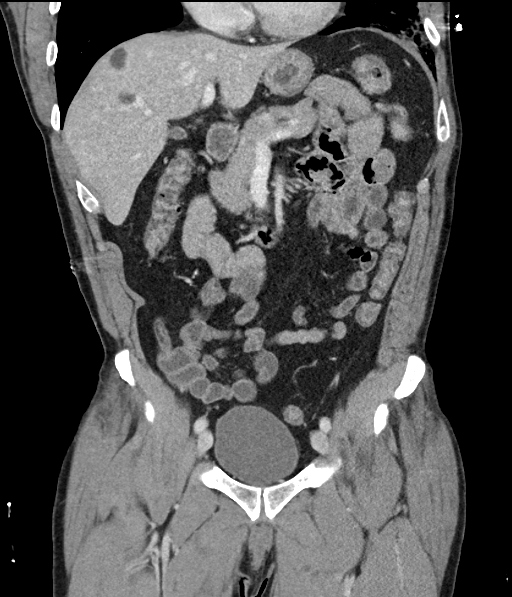
[im 56/101  soft-tissue]
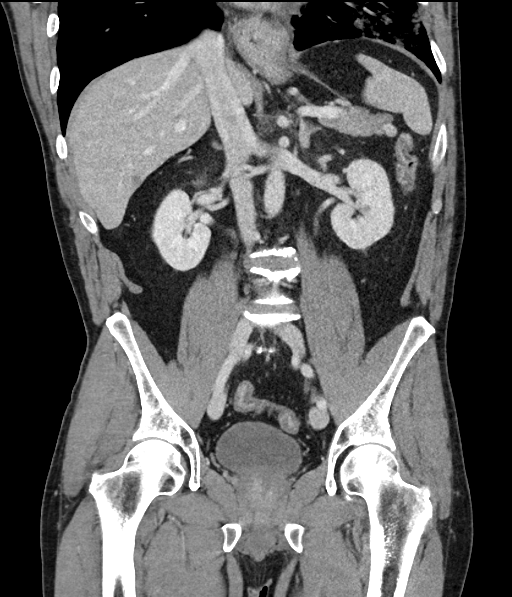

[16 of 46 positions shown; findings below may reference images not displayed]

FINDINGS: Lower chest: Left lower lobe airspace opacity is noted concerning
for pneumonia. Moderate size sliding-type hiatal hernia is noted.

Hepatobiliary: No gallstones or biliary dilatation is noted.
Multiple hepatic cysts are noted.

Pancreas: Unremarkable. No pancreatic ductal dilatation or
surrounding inflammatory changes.

Spleen: Normal in size without focal abnormality.

Adrenals/Urinary Tract: Adrenal glands are unremarkable. Kidneys are
normal, without renal calculi, focal lesion, or hydronephrosis.
Bladder is unremarkable.

Stomach/Bowel: Stomach is within normal limits. Appendix appears
normal. No evidence of bowel wall thickening, distention, or
inflammatory changes.

Vascular/Lymphatic: No significant vascular findings are present. No
enlarged abdominal or pelvic lymph nodes.

Reproductive: Prostate is unremarkable.

Other: No abdominal wall hernia or abnormality. No abdominopelvic
ascites.

Musculoskeletal: No acute or significant osseous findings.
IMPRESSION: Moderate size sliding-type hiatal hernia is noted.

Left lower lobe airspace opacity is noted consistent with pneumonia.

No other abnormality seen in the abdomen or pelvis.
